# Patient Record
Sex: Male | Born: 1970 | Race: White | Hispanic: No | Marital: Married | Smoking: Current every day smoker
Health system: Southern US, Community
[De-identification: ages and names within clinical notes are randomized; demographics above are authoritative.]

## PROBLEM LIST (undated history)

## (undated) DIAGNOSIS — C801 Malignant (primary) neoplasm, unspecified: Secondary | ICD-10-CM

---

## 2004-12-19 ENCOUNTER — Ambulatory Visit: Payer: Self-pay | Admitting: Internal Medicine

## 2009-11-26 ENCOUNTER — Encounter: Admission: RE | Admit: 2009-11-26 | Discharge: 2009-11-26 | Payer: Self-pay | Admitting: Family Medicine

## 2010-04-25 ENCOUNTER — Encounter: Payer: Self-pay | Admitting: Infectious Disease

## 2010-05-13 ENCOUNTER — Ambulatory Visit: Payer: Self-pay | Admitting: Infectious Disease

## 2010-05-13 DIAGNOSIS — L0390 Cellulitis, unspecified: Secondary | ICD-10-CM

## 2010-05-13 DIAGNOSIS — B351 Tinea unguium: Secondary | ICD-10-CM | POA: Insufficient documentation

## 2010-05-13 DIAGNOSIS — J309 Allergic rhinitis, unspecified: Secondary | ICD-10-CM | POA: Insufficient documentation

## 2010-05-13 DIAGNOSIS — C439 Malignant melanoma of skin, unspecified: Secondary | ICD-10-CM | POA: Insufficient documentation

## 2010-05-13 DIAGNOSIS — L0291 Cutaneous abscess, unspecified: Secondary | ICD-10-CM

## 2010-05-13 DIAGNOSIS — F17200 Nicotine dependence, unspecified, uncomplicated: Secondary | ICD-10-CM

## 2010-05-13 DIAGNOSIS — L732 Hidradenitis suppurativa: Secondary | ICD-10-CM

## 2010-05-13 DIAGNOSIS — K219 Gastro-esophageal reflux disease without esophagitis: Secondary | ICD-10-CM

## 2010-05-14 ENCOUNTER — Encounter: Payer: Self-pay | Admitting: Infectious Disease

## 2010-07-08 NOTE — Assessment & Plan Note (Signed)
Summary: new pt recurrent abscses   Visit Type:  Consult Referring Provider:  Cain Saupe Primary Provider:  Cain Saupe  CC:  new patient abcesses.  History of Present Illness: 40 yo who was diagnosed with HS 8 yrs ago on [place] by Dr. Leota Jacobsen.  He has been having lesions in his arms and groin since the age of 48. She did trial of doxycyline x 6 months, tetracycline x6 months. Brevoxil, Clindamycin topical. He has dropped the topical agents since then. H Since then has had  less flare ups. he has been followed closely by dr. Terri Piedra (Dermatology) Had cyst on back that flared up 2.5 to 3 wks ago had lesion that flared on back. 6 by 2 inches of erythema and induration and he added keflex to mix.  Pt was seen by urgent care in [place]. He had cultures obtained. Left on the doxycyline Pt went to see Dr. Abigail Miyamoto, who referred to [facility] (Dr. Dwain Sarna), who opened and packed this. Tuesday night several weeks ago saw Dr. Abigail Miyamoto who changed him to Bactrim DS one twice a day x 5 days and one two times a day since then. Area has improved dramatically. He saw Cleophas Dunker saw him 2 hrs ago. He has one in right axilla that has reared itself, and one lesion in crease of buttocks. I have received labs from Dr. Jillyn Hidden including HIV ELISA that was negative. I  incdo not hany culture data. I spent greater than 45 minuts with htis pt including greater than 50% of time coordinating care and with face to face counselling of the pt.Dr. Para Skeans ([place] Tollie Eth)  Problems Prior to Update: None  Medications Prior to Update: 1)  None  Current Medications (verified): 1)  Bactrim Ds 800-160 Mg Tabs (Sulfamethoxazole-Trimethoprim) .Marland Kitchen.. 1 Qd 2)  Zantac 150 Mg Tabs (Ranitidine Hcl) .Marland Kitchen.. 1 Two Times A Day  Allergies (verified): No Known Drug Allergies    Current Allergies (reviewed today): No known allergies  Past History:  Past Medical History: hidradenitis suppurativa melana sp removal gerd allergic  rhinitis morbid obesity  Past Surgical History: removal of tissue in leg complicated by infection (in [place]) Hernia repair  Family History: Mom and Dad both suffered from disease with recurrence of cyts  Social History: smoker, married, has two children (one adoped) no recreational drugs  Review of Systems       The patient complains of suspicious skin lesions.  The patient denies anorexia, fever, weight loss, weight gain, vision loss, decreased hearing, hoarseness, chest pain, syncope, dyspnea on exertion, peripheral edema, prolonged cough, headaches, hemoptysis, abdominal pain, melena, hematochezia, severe indigestion/heartburn, hematuria, incontinence, genital sores, muscle weakness, transient blindness, difficulty walking, depression, unusual weight change, abnormal bleeding, enlarged lymph nodes, and breast masses.    Vital Signs:  Patient profile:   40 year old male Height:      76 inches (193.04 cm) Weight:      258.50 pounds (117.50 kg) BMI:     31.58 Pulse rate:   94 / minute BP sitting:   128 / 88  (left arm)  Vitals Entered By: Starleen Arms CMA (May 13, 2010 1:59 PM) CC: new patient abcesses Is Patient Diabetic? No Pain Assessment Patient in pain? no      Nutritional Status BMI of > 30 = obese Nutritional Status Detail nl  Does patient need assistance? Functional Status Self care Ambulation Normal   Physical Exam  General:  alert, well-developed, well-nourished, and well-hydrated.   Head:  normocephalic, atraumatic, no abnormalities observed, and no abnormalities palpated.   Eyes:  vision grossly intact, pupils equal, and pupils round.   Ears:  no external deformities.   Nose:  no external deformity and no external erythema.   Mouth:  good dentition, no gingival abnormalities, no dental plaque, pharynx pink and moist, and no exudates.   Neck:  supple and full ROM.   Lungs:  normal respiratory effort, no crackles, and no wheezes.   Heart:   normal rate, regular rhythm, no murmur, and no gallop.   Abdomen:  soft, non-tender, and no distention.   Msk:  normal ROM and no joint deformities.   Extremities:  No clubbing, cyanosis, edema, or deformity noted with normal full range of motion of all joints.   Neurologic:  alert & oriented X3, strength normal in all extremities, and gait normal.   Skin:  he has healing ulcer on lower back where he had recent abscess, he has small raised lesion at top of crack of buttocks folds that is not fluctuant. He has an area under right armpit that had drainage recently but is itself not fluctuant at present Axillary Nodes:  minimal axillary lymphadenopathy Psych:  Oriented X3, memory intact for recent and remote, normally interactive, and good eye contact.     Impression & Recommendations:  Problem # 1:  CELLULITIS AND ABSCESS OF UNSPECIFIED SITE (ICD-682.9)  This has responded to I and D by [facility] and oral bactrim. I will have him finish an additoinal month of bactrim (fine to use for HS in general) then change back to his doxycycline. Would like to get culture data. His updated medication list for this problem includes:    Bactrim Ds 800-160 Mg Tabs (Sulfamethoxazole-trimethoprim) .Marland Kitchen... 1 qd  Orders: Consultation Level V (16109)  Problem # 2:  HIDRADENITIS SUPPURATIVA (ICD-705.83)  He does not appear to have severe disease and appears to respond to use of systemic antibiotics. Topical antibiotics might have less risk than systemic ones though he is not wanting to go off of his systemic ones at present. Antiperspirant use important. I have suggested he try dairy free and or low glycemic foods to see if this helps (there is mentoine of anecdotal help here on uptodate with these measures) and paleo diet could also have benefit of weight loss. He does not (undrstandably) want anti-androgen therapy  Orders: Consultation Level V (60454)  Problem # 3:  MORBID OBESITY (ICD-278.01)  weight loss  important though avoiding excessive sweating important as well  Orders: Consultation Level V (09811)  Medications Added to Medication List This Visit: 1)  Bactrim Ds 800-160 Mg Tabs (Sulfamethoxazole-trimethoprim) .Marland Kitchen.. 1 qd 2)  Zantac 150 Mg Tabs (Ranitidine hcl) .Marland Kitchen.. 1 two times a day  Patient Instructions: 1)  Finish out a total of one month more  of bactrim DS two times a day tablets then resume doxycyline therapy 2)  rtc to see Dr.  Daiva Eves in 2 months 3)  I would see what impact a dairy free and or low glycemic diet might have: 4)  [email or web address] 5)  [email or web address] 6)  [email or web address]

## 2010-07-08 NOTE — Miscellaneous (Signed)
Summary: HIPAA Restrictions  HIPAA Restrictions   Imported By: Florinda Marker 05/14/2010 10:31:03  _____________________________________________________________________  External Attachment:    Type:   Image     Comment:   External Document

## 2010-07-10 NOTE — Consult Note (Signed)
Summary: [facility]   Imported By: Florinda Marker 05/19/2010 09:25:57  _____________________________________________________________________  External Attachment:    Type:   Image     Comment:   External Document

## 2013-09-05 ENCOUNTER — Other Ambulatory Visit: Payer: Self-pay | Admitting: Family

## 2013-09-05 DIAGNOSIS — R1011 Right upper quadrant pain: Secondary | ICD-10-CM

## 2013-09-07 ENCOUNTER — Ambulatory Visit
Admission: RE | Admit: 2013-09-07 | Discharge: 2013-09-07 | Disposition: A | Discharge status: Home / Self Care | Payer: Managed Care, Other (non HMO) | Source: Ambulatory Visit | Attending: Family | Admitting: Family

## 2013-09-07 DIAGNOSIS — R1011 Right upper quadrant pain: Secondary | ICD-10-CM

## 2013-09-22 ENCOUNTER — Other Ambulatory Visit (HOSPITAL_COMMUNITY): Payer: Self-pay | Admitting: Gastroenterology

## 2013-09-22 DIAGNOSIS — R109 Unspecified abdominal pain: Secondary | ICD-10-CM

## 2013-09-26 ENCOUNTER — Encounter (HOSPITAL_COMMUNITY)
Admission: RE | Admit: 2013-09-26 | Discharge: 2013-09-26 | Disposition: A | Discharge status: Home / Self Care | Payer: Managed Care, Other (non HMO) | Source: Ambulatory Visit | Attending: Gastroenterology | Admitting: Gastroenterology

## 2013-09-26 DIAGNOSIS — R109 Unspecified abdominal pain: Secondary | ICD-10-CM | POA: Insufficient documentation

## 2013-09-26 MED ORDER — SINCALIDE 5 MCG IJ SOLR
0.0200 ug/kg | Freq: Once | INTRAMUSCULAR | Status: AC
  Administered 2013-09-26: 2.3 ug via INTRAVENOUS

## 2013-09-26 MED ORDER — TECHNETIUM TC 99M MEBROFENIN IV KIT
5.3000 | PACK | Freq: Once | INTRAVENOUS | Status: AC | PRN
  Administered 2013-09-26: 5 via INTRAVENOUS

## 2013-12-01 ENCOUNTER — Other Ambulatory Visit: Payer: Self-pay | Admitting: Gastroenterology

## 2013-12-01 DIAGNOSIS — R197 Diarrhea, unspecified: Secondary | ICD-10-CM

## 2013-12-01 DIAGNOSIS — R109 Unspecified abdominal pain: Secondary | ICD-10-CM

## 2013-12-05 ENCOUNTER — Ambulatory Visit
Admission: RE | Admit: 2013-12-05 | Discharge: 2013-12-05 | Disposition: A | Discharge status: Home / Self Care | Payer: Managed Care, Other (non HMO) | Source: Ambulatory Visit | Attending: Gastroenterology | Admitting: Gastroenterology

## 2013-12-05 DIAGNOSIS — R197 Diarrhea, unspecified: Secondary | ICD-10-CM

## 2013-12-05 DIAGNOSIS — R109 Unspecified abdominal pain: Secondary | ICD-10-CM

## 2014-12-01 IMAGING — NM NM HEPATO W/GB/PHARM/[PERSON_NAME]
1 series · 12 of 12 positions shown · non-contrast
Comparison: None.

CLINICAL DATA: Upper abdominal pain

EXAM:
NUCLEAR MEDICINE HEPATOBILIARY IMAGING WITH GALLBLADDER EF
Views:  Anterior right upper quadrant
Radionuclide: Technetium 99m Choletec
Dose:  5.3 mCi
Route of administration: Intravenous

[Series 1: hepato · 4.46mm/px · 2 acquisitions, 12 frames shown]
[im 1/2]
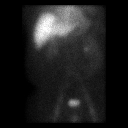
[im 1/2]
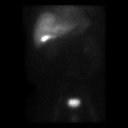
[im 1/2]
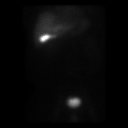
[im 1/2]
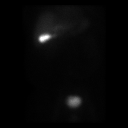
[im 1/2]
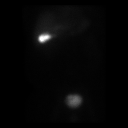
[im 1/2]
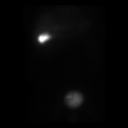
[im 2/2]
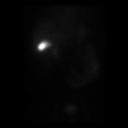
[im 2/2]
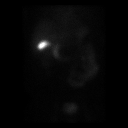
[im 2/2]
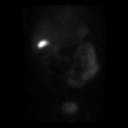
[im 2/2]
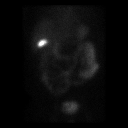
[im 2/2]
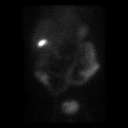
[im 2/2]
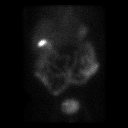

[12 of 12 positions shown; findings below may reference images not displayed]

FINDINGS: Liver uptake of radiotracer is normal. There is prompt visualization
of gallbladder and small bowel, indicating patency of the cystic and
common bile ducts. A weight based dose, 2.0 mcg, of CCK was
administered intravenously with calculation of the computer
generated ejection fraction of radiotracer from the gallbladder. The
patient did not experience clinical symptoms with CCK
administration. The ejection fraction of radiotracer from the
gallbladder is normal at 72.3%, normal greater than 38%.
IMPRESSION: Study within normal limits.

## 2015-12-05 ENCOUNTER — Emergency Department (HOSPITAL_COMMUNITY)
Admission: EM | Admit: 2015-12-05 | Discharge: 2015-12-05 | Disposition: A | Discharge status: Home / Self Care | Payer: Managed Care, Other (non HMO) | Attending: Emergency Medicine | Admitting: Emergency Medicine

## 2015-12-05 ENCOUNTER — Emergency Department (HOSPITAL_COMMUNITY): Payer: Managed Care, Other (non HMO)

## 2015-12-05 ENCOUNTER — Encounter (HOSPITAL_COMMUNITY): Payer: Self-pay

## 2015-12-05 DIAGNOSIS — F17210 Nicotine dependence, cigarettes, uncomplicated: Secondary | ICD-10-CM | POA: Insufficient documentation

## 2015-12-05 DIAGNOSIS — Z85828 Personal history of other malignant neoplasm of skin: Secondary | ICD-10-CM | POA: Insufficient documentation

## 2015-12-05 DIAGNOSIS — R079 Chest pain, unspecified: Secondary | ICD-10-CM

## 2015-12-05 DIAGNOSIS — Z7982 Long term (current) use of aspirin: Secondary | ICD-10-CM | POA: Diagnosis not present

## 2015-12-05 HISTORY — DX: Malignant (primary) neoplasm, unspecified: C80.1

## 2015-12-05 LAB — CBC
HCT: 47.7 % (ref 39.0–52.0)
HEMOGLOBIN: 16.7 g/dL (ref 13.0–17.0)
MCH: 36.2 pg — AB (ref 26.0–34.0)
MCHC: 35 g/dL (ref 30.0–36.0)
MCV: 103.5 fL — AB (ref 78.0–100.0)
Platelets: 250 10*3/uL (ref 150–400)
RBC: 4.61 MIL/uL (ref 4.22–5.81)
RDW: 12.6 % (ref 11.5–15.5)
WBC: 6.5 10*3/uL (ref 4.0–10.5)

## 2015-12-05 LAB — BASIC METABOLIC PANEL
ANION GAP: 8 (ref 5–15)
BUN: 9 mg/dL (ref 6–20)
CO2: 26 mmol/L (ref 22–32)
Calcium: 9.4 mg/dL (ref 8.9–10.3)
Chloride: 103 mmol/L (ref 101–111)
Creatinine, Ser: 0.88 mg/dL (ref 0.61–1.24)
GLUCOSE: 120 mg/dL — AB (ref 65–99)
POTASSIUM: 3.5 mmol/L (ref 3.5–5.1)
Sodium: 137 mmol/L (ref 135–145)

## 2015-12-05 LAB — D-DIMER, QUANTITATIVE: D-Dimer, Quant: <0.27 ug/mL-FEU (ref 0.00–0.50)

## 2015-12-05 LAB — I-STAT TROPONIN, ED
TROPONIN I, POC: 0 ng/mL (ref 0.00–0.08)
Troponin i, poc: 0 ng/mL (ref 0.00–0.08)

## 2015-12-05 NOTE — ED Notes (Signed)
Patient ambulated independently to the bathroom.  Denies being lightheaded

## 2015-12-05 NOTE — ED Notes (Signed)
Patient Alert and oriented X4. Stable and ambulatory. Patient verbalized understanding of the discharge instructions.  Patient belongings were taken by the patient.  

## 2015-12-05 NOTE — ED Notes (Signed)
Per Pt, Pt was flying back home yesterday when he started to become clammy with some sharp, shooting chest pain in mid-center with left arm numbness. Pt reports episodes of lightheadness that resolved. Pt went home to rest, and reports waking up this morning with similar symptoms. Diarrhea noted yesterday with some belching. Denies vomiting or radiation of pain.

## 2015-12-05 NOTE — ED Provider Notes (Signed)
CSN: [id number]     Arrival date & time 12/05/15  1242 History   None    Chief Complaint  Patient presents with  . Chest Pain     (Consider location/radiation/quality/duration/timing/severity/associated sxs/prior Treatment) HPI  Pt presenting with c/o chest pain.  Pt states he was flying back from [place] yesterday- had layover in [place] and while he was seated in the airport developed 2-3 sharp shooting chest pains in his left upper chest.  He became somewhat dizzy with the pains.  Also noted some tingling in his left upper extremity.  This morning when he woke up he had some tingling in his left arm as well.  No difficulty breathing.  No nausa or diaphoresis. No leg swelling. No hx of DVT/PE.  He has been flying for work.  No significant cough.  No treatment prior to arrival.  There are no other associated systemic symptoms, there are no other alleviating or modifying factors.   Past Medical History  Diagnosis Date  . Cancer [facility])     Skin cancer   History reviewed. No pertinent past surgical history. No family history on file. Social History  Substance Use Topics  . Smoking status: Current Every Day Smoker -- 1.00 packs/day    Types: Cigarettes  . Smokeless tobacco: Never Used  . Alcohol Use: 10.8 oz/week    18 Glasses of wine per week    Review of Systems  ROS reviewed and all otherwise negative except for mentioned in HPI    Allergies  Review of patient's allergies indicates no known allergies.  Home Medications   Prior to Admission medications   Medication Sig Start Date End Date Taking? Authorizing Provider  acetaminophen (TYLENOL) 325 MG tablet Take 650 mg by mouth every 6 (six) hours as needed for mild pain.   Yes Historical Provider, MD  aspirin-acetaminophen-caffeine (EXCEDRIN MIGRAINE) [phone] MG tablet Take 1 tablet by mouth every 6 (six) hours as needed for headache.   Yes Historical Provider, MD  doxycycline (ADOXA) 100 MG tablet Take 100-200 mg by  mouth daily. Pt alternates with tetracycline 500mg  bid 09/02/15  Yes Historical Provider, MD  ibuprofen (ADVIL,MOTRIN) 200 MG tablet Take 200 mg by mouth every 6 (six) hours as needed for moderate pain.   Yes Historical Provider, MD  naproxen sodium (ANAPROX) 220 MG tablet Take 220 mg by mouth 2 (two) times daily as needed (pain).   Yes Historical Provider, MD  tetracycline (ACHROMYCIN,SUMYCIN) 500 MG capsule Take 500 mg by mouth 2 (two) times daily.   Yes Historical Provider, MD   BP 128/92 mmHg  Pulse 75  Temp(Src) 98.2 F (36.8 C) (Oral)  Resp 14  SpO2 98%  Vitals reviewed Physical Exam  Physical Examination: General appearance - alert, well appearing, and in no distress Mental status - alert, oriented to person, place, and time Eyes - no conjunctival injection no scleral icterus Mouth - mucous membranes moist, pharynx normal without lesions Chest - clear to auscultation, no wheezes, rales or rhonchi, symmetric air entry Heart - normal rate, regular rhythm, normal S1, S2, no murmurs, rubs, clicks or gallops Abdomen - soft, nontender, nondistended, no masses or organomegaly Neurological - alert, oriented, normal speech Extremities - peripheral pulses normal, no pedal edema, no clubbing or cyanosis Skin - normal coloration and turgor, no rashes  ED Course  Procedures (including critical care time) Labs Review Labs Reviewed  BASIC METABOLIC PANEL - Abnormal; Notable for the following:    Glucose, Bld 120 (*)  All other components within normal limits  CBC - Abnormal; Notable for the following:    MCV 103.5 (*)    MCH 36.2 (*)    All other components within normal limits  D-DIMER, QUANTITATIVE (NOT AT [facility])  I-STAT TROPOININ, ED  Randolm Idol, ED    Imaging Review Dg Chest 2 View  12/05/2015  CLINICAL DATA:  Syncope and chest pain for 2 days EXAM: CHEST  2 VIEW COMPARISON:  11/26/2009 FINDINGS: The heart size and mediastinal contours are within normal limits. Both lungs  are clear. The visualized skeletal structures are unremarkable. IMPRESSION: No active cardiopulmonary disease. Electronically Signed   By: Inez Catalina M.D.   On: 12/05/2015 14:10   I have personally reviewed and evaluated these images and lab results as part of my medical decision-making.   EKG Interpretation   Date/Time:  Thursday December 05 2015 12:45:44 EDT Ventricular Rate:  101 PR Interval:  134 QRS Duration: 86 QT Interval:  334 QTC Calculation: 433 R Axis:   29 Text Interpretation:  Sinus tachycardia T wave abnormality, consider  inferior ischemia Abnormal ECG No old tracing to compare Confirmed by  [facility]  MD, Jaicob Dia [phone]) on 12/05/2015 1:47:00 PM      MDM   Final diagnoses:  Chest pain, unspecified chest pain type    Pt presenting with c/o chest pain.  Pt has a heart score of 2, was also flying for work- d-dimer is negative which effectively rules out PE in this patient.  2 sets of troponins were obtained and negative, doubt ACS.  Pt advised to f/u with PMD closely.  Discharged with strict return precautions.  Pt agreeable with plan.  Pt with a heart score of 2  Alfonzo Beers, MD 12/06/15 1016

## 2015-12-05 NOTE — Discharge Instructions (Signed)
Return to the ED with any concerns including difficulty breathing, recurrence of chest pain, leg swelling, fainting, decreased level of alertness/lethargy, or any other alarming symptoms

## 2016-02-16 NOTE — Progress Notes (Signed)
Cardiology Office Note   Date:  02/17/2016   ID:  William White, DOB [birth year], MRN [id number]  PCP:  Lujean Amel, MD    No chief complaint on file.  Chest pain  Wt Readings from Last 3 Encounters:  02/17/16 274 lb (124.3 kg)  09/26/13 258 lb (117 kg)  05/13/10 (!) 258 lb 8 oz (117.3 kg)       History of Present Illness: William White is a 45 y.o. male  With a h/o tobaco abuse who was flying back from [place] in Jun 2017- he had layover in [place] and while he was seated in the airport developed 2-3 sharp shooting chest pains in his left upper chest.  He became somewhat dizzy with the pains.  Also noted some tingling in his left upper extremity.  The next morning when he woke up he had some tingling in his left arm as well.  No difficulty breathing.  No nausa or diaphoresis. No leg swelling. No hx of DVT/PE.   He went to the ER the next day and had a negative evaluation.   Within 10 days,he had 2 more episodes like this.  He was dizzy with some irregular heart beat and left arm tingling.  Episodes would last 45 minutes.    Nothing since that time.  No regular exercise.    He quite smoking Aug 29.  He started Chantix.    No early CAD in the family.  Weight has increased over the past few years.  He eats better on the road.  Diet at home is very poor.      Past Medical History:  Diagnosis Date  . Cancer [facility])    Skin cancer    No past surgical history on file.   Current Outpatient Prescriptions  Medication Sig Dispense Refill  . acetaminophen (TYLENOL) 325 MG tablet Take 650 mg by mouth every 6 (six) hours as needed for mild pain.    Marland Kitchen aspirin-acetaminophen-caffeine (EXCEDRIN MIGRAINE) 250-250-65 MG tablet Take 1 tablet by mouth every 6 (six) hours as needed for headache.    . CHANTIX 1 MG tablet Take 1 mg by mouth 2 (two) times daily.    Marland Kitchen doxycycline (ADOXA) 100 MG tablet Take 100-200 mg by mouth daily. Pt alternates with tetracycline 500mg  bid   1  . ibuprofen (ADVIL,MOTRIN) 200 MG tablet Take 200 mg by mouth every 6 (six) hours as needed for moderate pain.    . naproxen sodium (ANAPROX) 220 MG tablet Take 220 mg by mouth 2 (two) times daily as needed (pain).    Marland Kitchen tetracycline (ACHROMYCIN,SUMYCIN) 500 MG capsule Take 500 mg by mouth 2 (two) times daily.     No current facility-administered medications for this visit.     Allergies:   Review of patient's allergies indicates no known allergies.    Social History:  The patient  reports that he has been smoking Cigarettes.  He has been smoking about 1.00 pack per day. He has never used smokeless tobacco. He reports that he drinks about 10.8 oz of alcohol per week . He reports that he does not use drugs.   Family History:  The patient's family history is not on file. No early CAD.   ROS:  Please see the history of present illness.   Otherwise, review of systems are positive for chest pain-above.   All other systems are reviewed and negative.    PHYSICAL EXAM: VS:  BP (!) 134/100 (BP Location: Left Arm,  Patient Position: Sitting, Cuff Size: Large)   Pulse 74   Ht 6\' 4"  (1.93 m)   Wt 274 lb (124.3 kg)   BMI 33.35 kg/m  , BMI Body mass index is 33.35 kg/m. GEN: Well nourished, well developed, in no acute distress  HEENT: normal  Neck: no JVD, carotid bruits, or masses Cardiac: RRR; no murmurs, rubs, or gallops,no edema  Respiratory:  clear to auscultation bilaterally, normal work of breathing GI: soft, nontender, nondistended, + BS MS: no deformity or atrophy  Skin: warm and dry, no rash Neuro:  Strength and sensation are intact Psych: euthymic mood, full affect   EKG:   The ekg ordered today demonstrates normal ECG   Recent Labs: 12/05/2015: BUN 9; Creatinine, Ser 0.88; Hemoglobin 16.7; Platelets 250; Potassium 3.5; Sodium 137   Lipid Panel No results found for: CHOL, TRIG, HDL, CHOLHDL, VLDL, LDLCALC, LDLDIRECT   Other studies Reviewed: Additional studies/ records  that were reviewed today with results demonstrating: ER records. Labs from [facility]: 2017: TG 276; HDL 36; LDL 99   ASSESSMENT AND PLAN:  1. Chest pain: Atypical. He has had several episodes. We'll plan for GXT prior to starting him on an exercise regimen. 2. Tobacco abuse:  He needs to stop smoking.  Now using Chantix. He has stopped for nearly 2 weeks. 3. Hypertriglyceridemia: Diet changes should help.  We talked about avoiding white bread and other sources of carbohydrates. This will help his triglycerides. He'll try to stick to more high-fiber high-protein foods. He also wants to lose weight. This will help his long-term risk profile as well.   Current medicines are reviewed at length with the patient today.  The patient concerns regarding his medicines were addressed.  The following changes have been made:  No change  Labs/ tests ordered today include:  No orders of the defined types were placed in this encounter.   Recommend 150 minutes/week of aerobic exercise Low fat, low carb, high fiber diet recommended  Disposition:   FU for GXT; if normal then 1 year f/u   Signed, Larae Grooms, MD  02/17/2016 11:56 AM    [facility] [address], [place], [place]  [address] Phone: [phone]; Fax: [phone]

## 2016-02-17 ENCOUNTER — Ambulatory Visit (INDEPENDENT_AMBULATORY_CARE_PROVIDER_SITE_OTHER): Payer: Managed Care, Other (non HMO) | Admitting: Interventional Cardiology

## 2016-02-17 ENCOUNTER — Encounter (INDEPENDENT_AMBULATORY_CARE_PROVIDER_SITE_OTHER): Payer: Self-pay

## 2016-02-17 ENCOUNTER — Encounter: Payer: Self-pay | Admitting: Interventional Cardiology

## 2016-02-17 VITALS — BP 134/100 | HR 74 | Ht 76.0 in | Wt 274.0 lb

## 2016-02-17 DIAGNOSIS — F17200 Nicotine dependence, unspecified, uncomplicated: Secondary | ICD-10-CM | POA: Diagnosis not present

## 2016-02-17 DIAGNOSIS — R079 Chest pain, unspecified: Secondary | ICD-10-CM | POA: Diagnosis not present

## 2016-02-17 DIAGNOSIS — E669 Obesity, unspecified: Secondary | ICD-10-CM | POA: Insufficient documentation

## 2016-02-17 NOTE — Patient Instructions (Signed)
Your physician recommends that you continue on your current medications as directed. Please refer to the Current Medication list given to you today.  Your physician has requested that you have an exercise tolerance test. For further information please visit [email or web address]. Please also follow instruction sheet, as given.  Your physician wants you to follow-up in: 1 year with Dr. Irish Lack.  You will receive a reminder letter in the mail two months in advance. If you don't receive a letter, please call our office to schedule the follow-up appointment.

## 2016-02-24 ENCOUNTER — Encounter: Payer: Self-pay | Admitting: Interventional Cardiology

## 2016-02-25 ENCOUNTER — Ambulatory Visit (INDEPENDENT_AMBULATORY_CARE_PROVIDER_SITE_OTHER): Payer: Managed Care, Other (non HMO)

## 2016-02-25 DIAGNOSIS — R079 Chest pain, unspecified: Secondary | ICD-10-CM

## 2016-02-25 LAB — EXERCISE TOLERANCE TEST
CHL CUP MPHR: 176 {beats}/min
CHL CUP STRESS STAGE 1 GRADE: 0 %
CHL CUP STRESS STAGE 1 SPEED: 0 mph
CHL CUP STRESS STAGE 2 HR: 93 {beats}/min
CHL CUP STRESS STAGE 2 SPEED: 1 mph
CHL CUP STRESS STAGE 3 GRADE: 0.1 %
CHL CUP STRESS STAGE 3 HR: 93 {beats}/min
CHL CUP STRESS STAGE 4 DBP: 93 mmHg
CHL CUP STRESS STAGE 4 SBP: 163 mmHg
CHL CUP STRESS STAGE 4 SPEED: 1.7 mph
CHL CUP STRESS STAGE 5 GRADE: 12 %
CHL CUP STRESS STAGE 5 HR: 137 {beats}/min
CHL CUP STRESS STAGE 6 DBP: 89 mmHg
CHL CUP STRESS STAGE 6 GRADE: 14 %
CHL CUP STRESS STAGE 6 SPEED: 3.4 mph
CHL CUP STRESS STAGE 7 GRADE: 16 %
CHL CUP STRESS STAGE 7 HR: 176 {beats}/min
CHL CUP STRESS STAGE 7 SPEED: 4.2 mph
CHL CUP STRESS STAGE 8 DBP: 84 mmHg
CHL CUP STRESS STAGE 8 HR: 157 {beats}/min
CHL CUP STRESS STAGE 8 SBP: 184 mmHg
CHL CUP STRESS STAGE 9 HR: 106 {beats}/min
CHL CUP STRESS STAGE 9 SBP: 164 mmHg
CSEPED: 10 min
CSEPEW: 11.7 METS
Exercise duration (sec): 1 s
Peak HR: 176 {beats}/min
Percent HR: 100 %
Percent of predicted max HR: 100 %
RPE: 17
Rest HR: 78 {beats}/min
Stage 1 DBP: 94 mmHg
Stage 1 HR: 86 {beats}/min
Stage 1 SBP: 152 mmHg
Stage 2 Grade: 0 %
Stage 3 Speed: 1 mph
Stage 4 Grade: 10 %
Stage 4 HR: 120 {beats}/min
Stage 5 DBP: 93 mmHg
Stage 5 SBP: 197 mmHg
Stage 5 Speed: 2.5 mph
Stage 6 HR: 166 {beats}/min
Stage 6 SBP: 212 mmHg
Stage 8 Grade: 0 %
Stage 8 Speed: 0 mph
Stage 9 DBP: 94 mmHg
Stage 9 Grade: 0 %
Stage 9 Speed: 0 mph

## 2017-02-08 IMAGING — DX DG CHEST 2V
2 series · 2 of 2 positions shown · non-contrast
Comparison: 11/26/2009

CLINICAL DATA: Syncope and chest pain for 2 days

EXAM:
CHEST  2 VIEW

[chest pa]
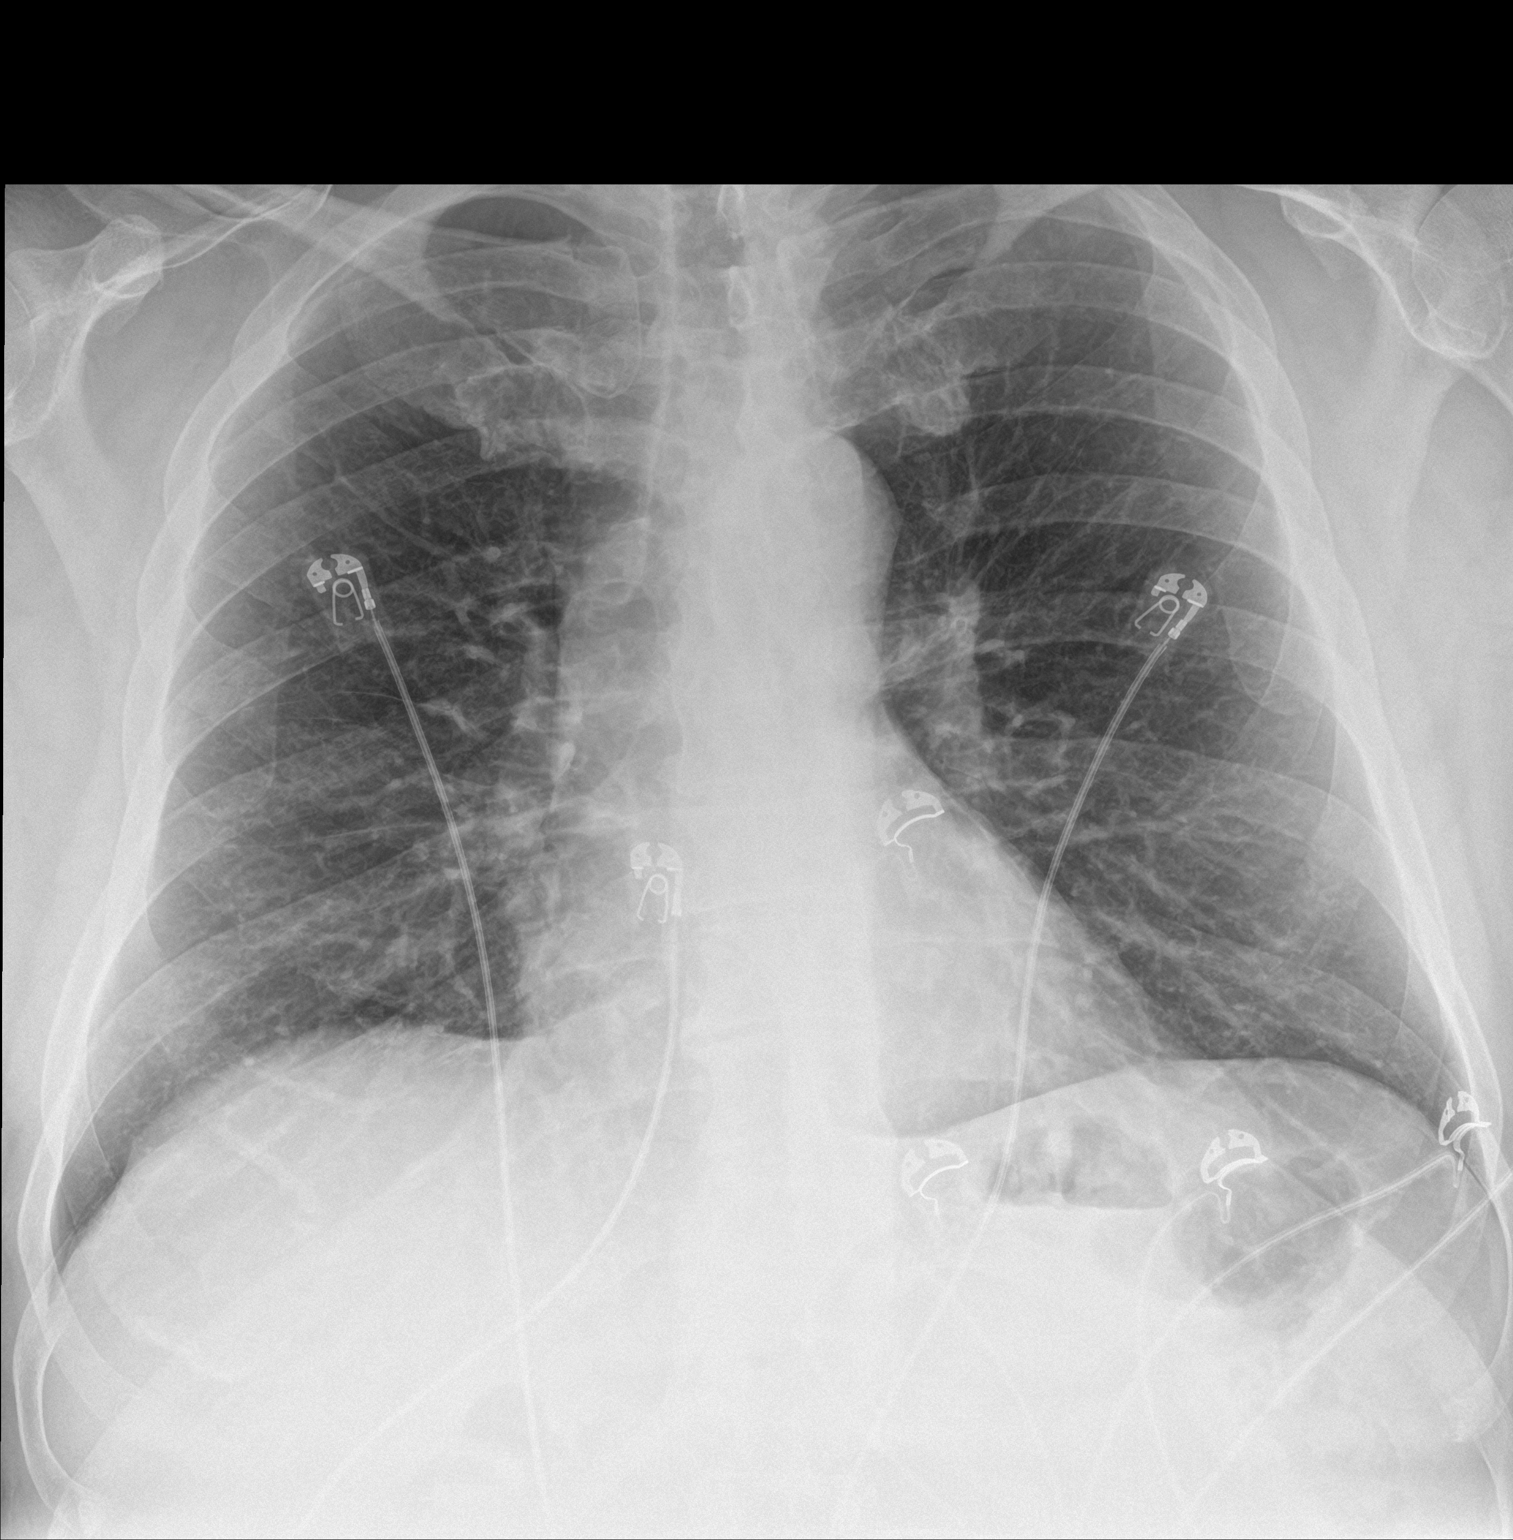

[chest lat]
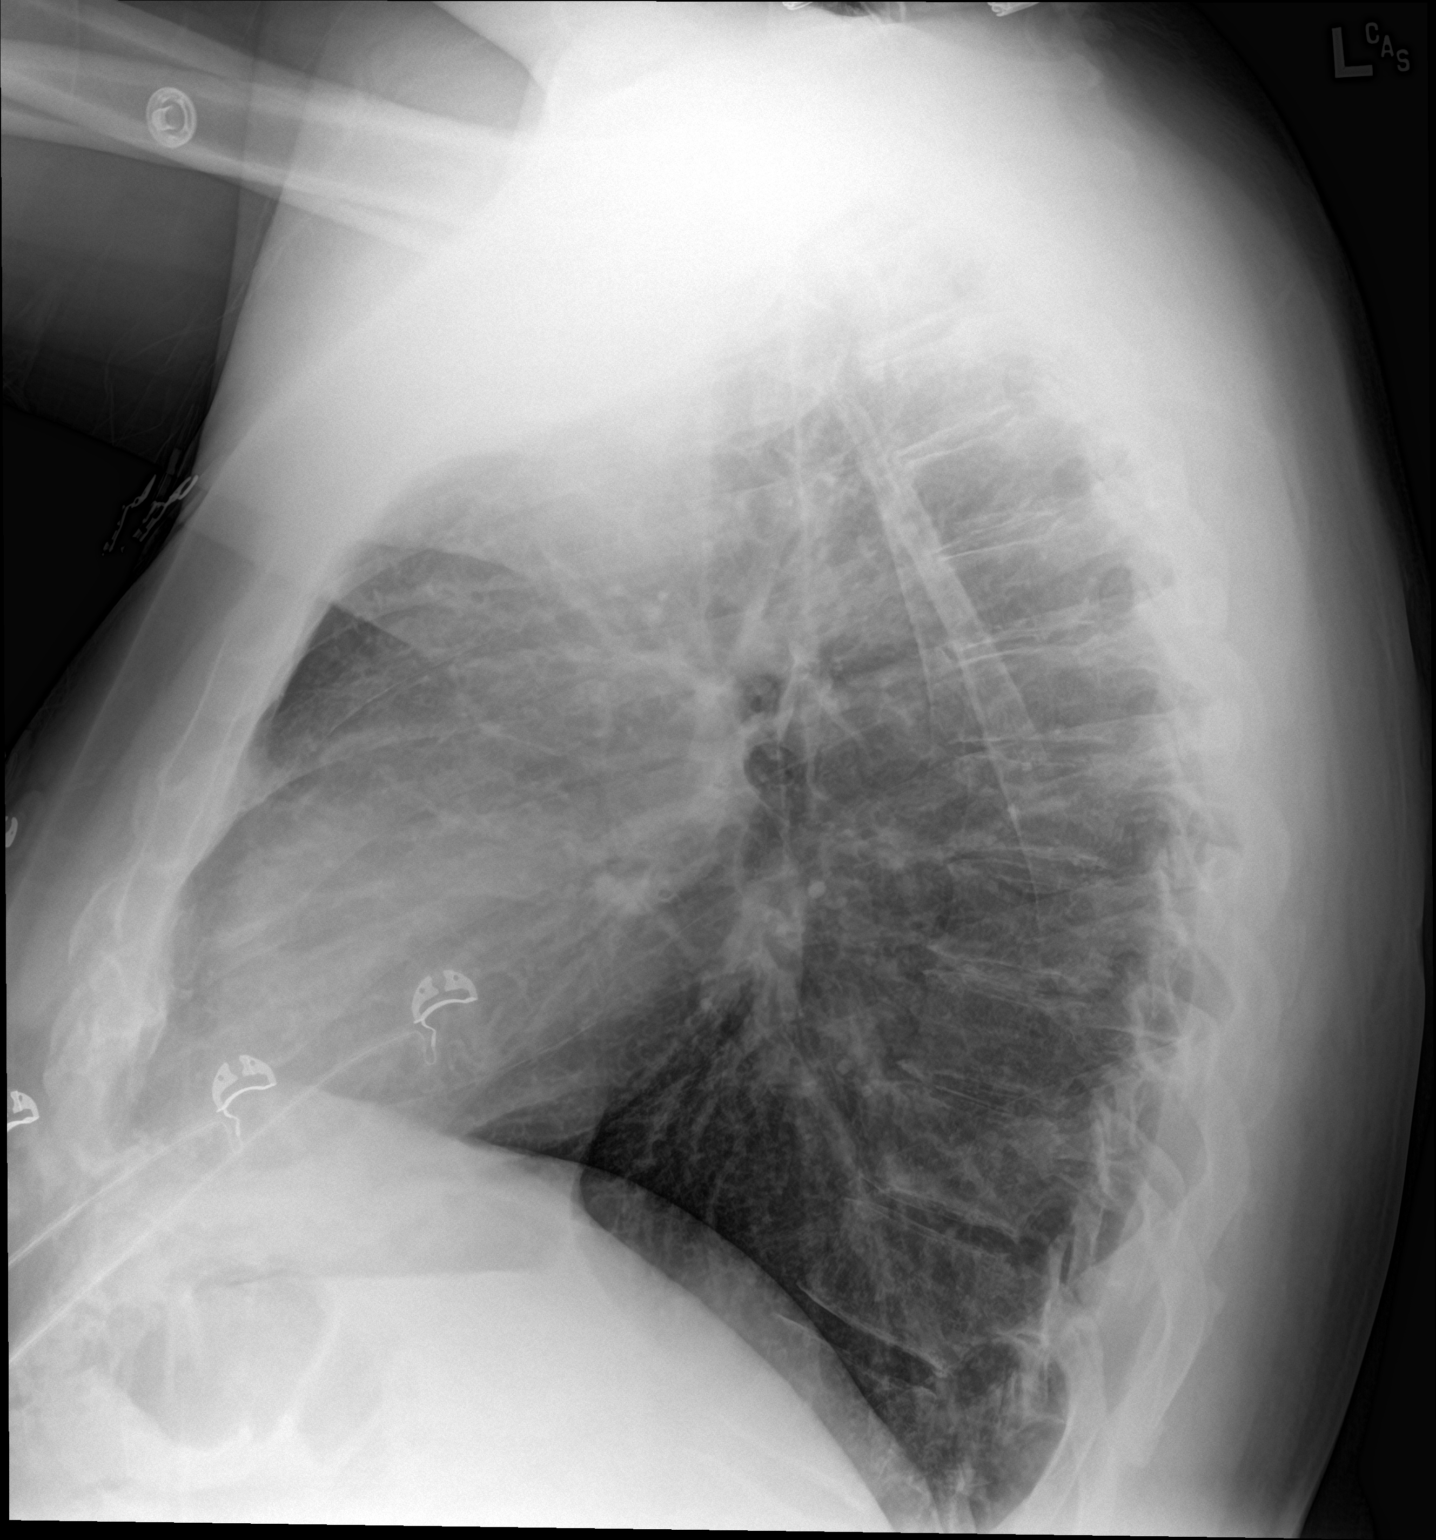

[2 of 2 positions shown; findings below may reference images not displayed]

FINDINGS: The heart size and mediastinal contours are within normal limits.
Both lungs are clear. The visualized skeletal structures are
unremarkable.
IMPRESSION: No active cardiopulmonary disease.

## 2023-04-06 ENCOUNTER — Other Ambulatory Visit: Payer: Self-pay

## 2023-04-06 DIAGNOSIS — F17210 Nicotine dependence, cigarettes, uncomplicated: Secondary | ICD-10-CM

## 2023-04-06 DIAGNOSIS — Z87891 Personal history of nicotine dependence: Secondary | ICD-10-CM

## 2023-04-06 DIAGNOSIS — Z122 Encounter for screening for malignant neoplasm of respiratory organs: Secondary | ICD-10-CM

## 2023-04-09 ENCOUNTER — Ambulatory Visit (INDEPENDENT_AMBULATORY_CARE_PROVIDER_SITE_OTHER): Payer: Commercial Managed Care - PPO | Admitting: Acute Care

## 2023-04-09 DIAGNOSIS — F17210 Nicotine dependence, cigarettes, uncomplicated: Secondary | ICD-10-CM

## 2023-04-09 NOTE — Patient Instructions (Signed)

## 2023-04-09 NOTE — Progress Notes (Signed)
 Virtual Visit via Telephone Note  I connected with William White on 04/09/23 at  2:30 PM EDT by telephone and verified that I am speaking with the correct person using two identifiers.  Location: Patient: in home Provider: [address], [place], [place], [address]    Shared Decision Making Visit Lung Cancer Screening Program [phone])   Eligibility: Age 52 y.o. Pack Years Smoking History Calculation 36 (# packs/per year x # years smoked) Recent History of coughing up blood  no Unexplained weight loss? no ( >Than 15 pounds within the last 6 months ) Prior History Lung / other cancer melanoma- 10 years ago (Diagnosis within the last 5 years already requiring surveillance chest CT Scans). Smoking Status Current Smoker Former Smokers: Years since quit: NA  Quit Date: NA  Visit Components: Discussion included one or more decision making aids. yes Discussion included risk/benefits of screening. yes Discussion included potential follow up diagnostic testing for abnormal scans. yes Discussion included meaning and risk of over diagnosis. yes Discussion included meaning and risk of False Positives. yes Discussion included meaning of total radiation exposure. yes  Counseling Included: Importance of adherence to annual lung cancer LDCT screening. yes Impact of comorbidities on ability to participate in the program. yes Ability and willingness to under diagnostic treatment. yes  Smoking Cessation Counseling: Current Smokers:  Discussed importance of smoking cessation. yes Information about tobacco cessation classes and interventions provided to patient. yes Patient provided with "ticket" for LDCT Scan. yes Symptomatic Patient. no  Counseling NA Diagnosis Code: Tobacco Use Z72.0 Asymptomatic Patient yes  Counseling (Intermediate counseling: > three minutes counseling) U0454 Former Smokers:  Discussed the importance of maintaining cigarette abstinence. yes Diagnosis Code:  Personal History of Nicotine Dependence. U98.119 Information about tobacco cessation classes and interventions provided to patient. Yes Patient provided with "ticket" for LDCT Scan. yes Written Order for Lung Cancer Screening with LDCT placed in Epic. Yes (CT Chest Lung Cancer Screening Low Dose W/O CM) [id number] Z12.2-Screening of respiratory organs Z87.891-Personal history of nicotine dependence   Karl Bales, RN 04/09/23

## 2023-04-16 ENCOUNTER — Ambulatory Visit
Admission: RE | Admit: 2023-04-16 | Discharge: 2023-04-16 | Disposition: A | Discharge status: Home / Self Care | Payer: Commercial Managed Care - PPO | Source: Ambulatory Visit | Attending: Family Medicine | Admitting: Family Medicine

## 2023-04-16 DIAGNOSIS — F17210 Nicotine dependence, cigarettes, uncomplicated: Secondary | ICD-10-CM

## 2023-04-16 DIAGNOSIS — Z87891 Personal history of nicotine dependence: Secondary | ICD-10-CM

## 2023-04-16 DIAGNOSIS — Z122 Encounter for screening for malignant neoplasm of respiratory organs: Secondary | ICD-10-CM

## 2023-05-14 ENCOUNTER — Other Ambulatory Visit: Payer: Self-pay | Admitting: Acute Care

## 2023-05-14 DIAGNOSIS — Z122 Encounter for screening for malignant neoplasm of respiratory organs: Secondary | ICD-10-CM

## 2023-05-14 DIAGNOSIS — F17210 Nicotine dependence, cigarettes, uncomplicated: Secondary | ICD-10-CM

## 2023-05-14 DIAGNOSIS — Z87891 Personal history of nicotine dependence: Secondary | ICD-10-CM

## 2023-10-22 ENCOUNTER — Other Ambulatory Visit: Payer: Self-pay | Admitting: Physician Assistant

## 2023-10-22 DIAGNOSIS — M5416 Radiculopathy, lumbar region: Secondary | ICD-10-CM

## 2023-10-26 ENCOUNTER — Ambulatory Visit
Admission: RE | Admit: 2023-10-26 | Discharge: 2023-10-26 | Disposition: A | Discharge status: Home / Self Care | Source: Ambulatory Visit | Attending: Physician Assistant | Admitting: Physician Assistant

## 2023-10-26 DIAGNOSIS — M5416 Radiculopathy, lumbar region: Secondary | ICD-10-CM

## 2024-03-22 ENCOUNTER — Other Ambulatory Visit (HOSPITAL_BASED_OUTPATIENT_CLINIC_OR_DEPARTMENT_OTHER): Payer: Self-pay | Admitting: Family Medicine

## 2024-03-22 DIAGNOSIS — E7800 Pure hypercholesterolemia, unspecified: Secondary | ICD-10-CM

## 2024-03-27 ENCOUNTER — Ambulatory Visit (HOSPITAL_BASED_OUTPATIENT_CLINIC_OR_DEPARTMENT_OTHER)
Admission: RE | Admit: 2024-03-27 | Discharge: 2024-03-27 | Disposition: A | Discharge status: Home / Self Care | Payer: Self-pay | Source: Ambulatory Visit | Attending: Family Medicine | Admitting: Family Medicine

## 2024-03-27 DIAGNOSIS — E7800 Pure hypercholesterolemia, unspecified: Secondary | ICD-10-CM | POA: Insufficient documentation

## 2024-04-13 ENCOUNTER — Telehealth: Payer: Self-pay | Admitting: Acute Care

## 2024-04-13 NOTE — Telephone Encounter (Signed)
 Returned call for scheduling LDCT> no answer.  Left VM to call [phone] for scheduling or [phone] if unable to reach

## 2024-04-20 ENCOUNTER — Ambulatory Visit
Admission: RE | Admit: 2024-04-20 | Discharge: 2024-04-20 | Disposition: A | Source: Ambulatory Visit | Attending: Acute Care | Admitting: Acute Care

## 2024-04-20 DIAGNOSIS — F17210 Nicotine dependence, cigarettes, uncomplicated: Secondary | ICD-10-CM

## 2024-04-20 DIAGNOSIS — Z87891 Personal history of nicotine dependence: Secondary | ICD-10-CM

## 2024-04-20 DIAGNOSIS — Z122 Encounter for screening for malignant neoplasm of respiratory organs: Secondary | ICD-10-CM

## 2024-04-27 ENCOUNTER — Ambulatory Visit

## 2024-04-27 VITALS — BP 121/77 | HR 105 | Temp 98.0°F | Ht 75.0 in | Wt 228.0 lb

## 2024-04-27 DIAGNOSIS — Z122 Encounter for screening for malignant neoplasm of respiratory organs: Secondary | ICD-10-CM

## 2024-04-27 DIAGNOSIS — Z720 Tobacco use: Secondary | ICD-10-CM

## 2024-04-27 DIAGNOSIS — J432 Centrilobular emphysema: Secondary | ICD-10-CM | POA: Diagnosis not present

## 2024-04-27 DIAGNOSIS — F17210 Nicotine dependence, cigarettes, uncomplicated: Secondary | ICD-10-CM | POA: Diagnosis not present

## 2024-04-27 DIAGNOSIS — R0602 Shortness of breath: Secondary | ICD-10-CM

## 2024-04-27 DIAGNOSIS — R062 Wheezing: Secondary | ICD-10-CM

## 2024-04-27 DIAGNOSIS — I2510 Atherosclerotic heart disease of native coronary artery without angina pectoris: Secondary | ICD-10-CM

## 2024-04-27 LAB — CBC WITH DIFFERENTIAL/PLATELET
Basophils Absolute: 0 K/uL (ref 0.0–0.1)
Basophils Relative: 0.7 % (ref 0.0–3.0)
Eosinophils Absolute: 0.3 K/uL (ref 0.0–0.7)
Eosinophils Relative: 4 % (ref 0.0–5.0)
HCT: 42.4 % (ref 39.0–52.0)
Hemoglobin: 14.6 g/dL (ref 13.0–17.0)
Lymphocytes Relative: 36.2 % (ref 12.0–46.0)
Lymphs Abs: 2.5 K/uL (ref 0.7–4.0)
MCHC: 34.4 g/dL (ref 30.0–36.0)
MCV: 111.5 fl — ABNORMAL HIGH (ref 78.0–100.0)
Monocytes Absolute: 0.7 K/uL (ref 0.1–1.0)
Monocytes Relative: 10.7 % (ref 3.0–12.0)
Neutro Abs: 3.4 K/uL (ref 1.4–7.7)
Neutrophils Relative %: 48.4 % (ref 43.0–77.0)
Platelets: 351 K/uL (ref 150.0–400.0)
RBC: 3.8 Mil/uL — ABNORMAL LOW (ref 4.22–5.81)
RDW: 12.8 % (ref 11.5–15.5)
WBC: 7 K/uL (ref 4.0–10.5)

## 2024-04-27 MED ORDER — BREZTRI AEROSPHERE 160-9-4.8 MCG/ACT IN AERO
2.0000 | INHALATION_SPRAY | Freq: Two times a day (BID) | RESPIRATORY_TRACT | Status: AC
Start: 2024-04-27 — End: ?

## 2024-04-27 MED ORDER — FLUTICASONE-SALMETEROL 115-21 MCG/ACT IN AERO: 2.0000 | INHALATION_SPRAY | Freq: Two times a day (BID) | RESPIRATORY_TRACT | 12 refills | Status: AC

## 2024-04-27 MED ORDER — VARENICLINE TARTRATE 0.5 MG PO TABS: ORAL_TABLET | ORAL | 0 refills | Status: AC

## 2024-04-27 MED ORDER — VARENICLINE TARTRATE 1 MG PO TABS: 1.0000 mg | ORAL_TABLET | Freq: Two times a day (BID) | ORAL | 2 refills | Status: AC

## 2024-04-27 NOTE — Progress Notes (Signed)
 New Patient Pulmonology Office Visit   Subjective:  Patient ID: William White, male    DOB: [birth year]  MRN: [id number]  Referred by: Regino Slater, MD  CC:  Chief Complaint  Patient presents with   Consult    History of oneuomia    Shortness of Breath    Any kind of activity. Started a year ago    Shortness of Breath   William White is a 53 y.o. male who is referred to this clinic for emphysema  Discussed the use of AI scribe software for clinical note transcription with the patient, who gave verbal consent to proceed.  History of Present Illness William White is a 53 year old male who presents with shortness of breath. He was referred to pulmonary and cardiology for further evaluation.  He experiences shortness of breath, particularly during physical activities such as climbing stairs or doing yard work. He often needs to stop and take deep breaths due to lightheadedness. He has a significant smoking history of 37 years and has attempted to quit multiple times, using Chantix  and Wellbutrin with temporary success.  He had pneumonia twice last year, which required antibiotics and coincided with the flu. Last winter was particularly difficult, with frequent visits to the walk-in clinic. He received a flu shot this year.  He experiences daily cough and wheezing, with sinus drainage causing a sensation of something being stuck in his throat. No asthma and no family history of allergies.  He has a history of childhood allergies, for which he received allergy shots twice a week, but he has outgrown them. 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     Review of Systems  Respiratory:  Positive for shortness of breath.     Allergies: Patient has no known allergies.  Current Outpatient Medications:    acetaminophen (TYLENOL) 325 MG tablet, Take 650 mg by mouth every 6 (six) hours as needed for mild pain.,  Disp: , Rfl:    aspirin EC 81 MG tablet, Take 81 mg by mouth daily. Swallow whole., Disp: , Rfl:    fluticasone -salmeterol (ADVAIR HFA) 115-21 MCG/ACT inhaler, Inhale 2 puffs into the lungs 2 (two) times daily., Disp: 1 each, Rfl: 12   varenicline  (CHANTIX ) 0.5 MG tablet, Start by taking 1 tablet a day for initial 3 days, then 1 tab twice a day for 4 days, Disp: 11 tablet, Rfl: 0   varenicline  (CHANTIX ) 1 MG tablet, Take 1 tablet (1 mg total) by mouth 2 (two) times daily., Disp: 30 tablet, Rfl: 2   aspirin-acetaminophen-caffeine (EXCEDRIN MIGRAINE) 250-250-65 MG tablet, Take 1 tablet by mouth every 6 (six) hours as needed for headache. (Patient not taking: Reported on 04/27/2024), Disp: , Rfl:    doxycycline (ADOXA) 100 MG tablet, Take 100-200 mg by mouth daily. Pt alternates with tetracycline 500mg  bid, Disp: , Rfl: 1   ibuprofen (ADVIL,MOTRIN) 200 MG tablet, Take 200 mg by mouth every 6 (six) hours as needed for moderate pain. (Patient not taking: Reported on 04/27/2024), Disp: , Rfl:    naproxen sodium (ANAPROX) 220 MG tablet, Take 220 mg by mouth 2 (two) times daily as needed (pain)., Disp: , Rfl:    tetracycline (ACHROMYCIN,SUMYCIN) 500 MG capsule, Take 500 mg by mouth 2 (two) times daily., Disp: , Rfl:  Past Medical History:  Diagnosis Date   Cancer (HCC)    Skin cancer   History reviewed. No pertinent  surgical history. Family History  Problem Relation Age of Onset   Heart disease Neg Hx    Social History   Socioeconomic History   Marital status: Married    Spouse name: Not on file   Number of children: Not on file   Years of education: Not on file   Highest education level: Not on file  Occupational History   Not on file  Tobacco Use   Smoking status: Every Day    Current packs/day: 1.00    Types: Cigarettes   Smokeless tobacco: Never   Tobacco comments:    Started smoking at age 51. 1  1/2 daily. 04/27/2024.  Substance and Sexual Activity   Alcohol use: Yes     Alcohol/week: 18.0 standard drinks of alcohol    Types: 18 Glasses of wine per week   Drug use: No   Sexual activity: Not on file  Other Topics Concern   Not on file  Social History Narrative   Not on file   Social Drivers of Health   Financial Resource Strain: Not on file  Food Insecurity: Not on file  Transportation Needs: Not on file  Physical Activity: Not on file  Stress: Not on file  Social Connections: Not on file  Intimate Partner Violence: Not on file         Objective:  BP 121/77   Pulse (!) 105   Temp 98 F (36.7 C) (Oral)   Ht 6' 3 (1.905 m)   Wt 228 lb (103.4 kg)   SpO2 98%   BMI 28.50 kg/m    Physical Exam Constitutional:      General: He is not in acute distress.    Appearance: Normal appearance.  HENT:     Mouth/Throat:     Mouth: Mucous membranes are moist.  Cardiovascular:     Rate and Rhythm: Normal rate.  Pulmonary:     Effort: No respiratory distress.     Breath sounds: Wheezing present. No rales.  Musculoskeletal:     Right lower leg: No edema.     Left lower leg: No edema.  Skin:    General: Skin is warm.  Neurological:     Mental Status: He is alert and oriented to person, place, and time.  Psychiatric:        Mood and Affect: Mood normal.     Diagnostic Review:    Pft     No data to display               Results RADIOLOGY Lung CT: No concerning nodules, minimal emphysema (04/2024) Cardiac CT: Calcifications on coronary vessels as per the pt     Assessment & Plan:   Assessment & Plan Centrilobular emphysema (HCC) Discussed the symptoms, etiology, pathophysiology, diagnostic test, treatment, flare ups,  prognosis  of copd Pt has significant wheezing. Reports childhood history of significant allergies and needing allergy shots 2 bronchitis episodes last year Uncontrolled COPD Will prescribe advair  2 puff BID I advised pt to rinse mouth after inhaler use  Orders:   Pulmonary function test; Future   CBC  with Differential   IgE; Future   fluticasone -salmeterol (ADVAIR HFA) 115-21 MCG/ACT inhaler; Inhale 2 puffs into the lungs 2 (two) times daily.  Tobacco abuse Smoking/Tobacco Cessation Counseling William White is a current user of tobacco or nicotine products. He is ready to quit at this time. Counseling provided today addressed the risks of continued use and the benefits of cessation. Discussed tobacco/nicotine use  history, readiness to quit, and evidence-based treatment options including behavioral strategies, support resources, and pharmacologic therapies. Provided encouragement and educational materials on steps and resources to quit smoking. Patient questions were addressed, and follow-up recommended for continued support. Total time spent on counseling: 10 minutes. Pt has quit smoking in the past with the help of chantix. Requesting the same this time    Orders:   CBC with Differential   IgE; Future  Screening for lung cancer Reviewed ct chest Nov 2024 and Nov 2025 No concerning nodules Due for scan in Nov 2026    SOB (shortness of breath) Likely related to copd Need to r/o ischemic causes as well    Coronary artery calcification D/w patient Pt is undergoing cardiac work up     Wheezing Can't rule out asthma copd overlap Childhood hx of allergies Orders:   CBC with Differential   IgE; Future   fluticasone-salmeterol (ADVAIR HFA) 115-21 MCG/ACT inhaler; Inhale 2 puffs into the lungs 2 (two) times daily.  Uptodate with pneumonia and flu shot  Thank you for the opportunity to take part in the care of William White   Return in about 7 weeks (around 06/15/2024).   Suhan Paci Pleas, MD [facility] Office: [phone]

## 2024-04-27 NOTE — Patient Instructions (Addendum)
 It was a pleasure to see you today. Please have your labs drawn in our clinic today -checking for allergies due to wheezing Your pulmonary function test will be scheduled closer to your next follow up visit. Please rinse your mouth after inhaler use.

## 2024-04-28 ENCOUNTER — Other Ambulatory Visit: Payer: Self-pay

## 2024-04-28 DIAGNOSIS — Z122 Encounter for screening for malignant neoplasm of respiratory organs: Secondary | ICD-10-CM

## 2024-04-28 DIAGNOSIS — Z87891 Personal history of nicotine dependence: Secondary | ICD-10-CM

## 2024-04-28 DIAGNOSIS — F17210 Nicotine dependence, cigarettes, uncomplicated: Secondary | ICD-10-CM

## 2024-04-28 LAB — IGE: IgE (Immunoglobulin E), Serum: 126 kU/L — ABNORMAL HIGH (ref <=114)

## 2024-05-25 ENCOUNTER — Ambulatory Visit (INDEPENDENT_AMBULATORY_CARE_PROVIDER_SITE_OTHER): Admitting: Nurse Practitioner

## 2024-05-25 VITALS — BP 130/74 | HR 77 | Ht 75.0 in | Wt 231.0 lb

## 2024-05-25 DIAGNOSIS — Z7189 Other specified counseling: Secondary | ICD-10-CM

## 2024-05-25 DIAGNOSIS — I10 Essential (primary) hypertension: Secondary | ICD-10-CM | POA: Diagnosis not present

## 2024-05-25 DIAGNOSIS — Z720 Tobacco use: Secondary | ICD-10-CM | POA: Diagnosis not present

## 2024-05-25 DIAGNOSIS — I2583 Coronary atherosclerosis due to lipid rich plaque: Secondary | ICD-10-CM

## 2024-05-25 DIAGNOSIS — E785 Hyperlipidemia, unspecified: Secondary | ICD-10-CM

## 2024-05-25 DIAGNOSIS — I2510 Atherosclerotic heart disease of native coronary artery without angina pectoris: Secondary | ICD-10-CM

## 2024-05-25 DIAGNOSIS — I7121 Aneurysm of the ascending aorta, without rupture: Secondary | ICD-10-CM

## 2024-05-25 MED ORDER — METOPROLOL TARTRATE 100 MG PO TABS: 100.0000 mg | ORAL_TABLET | Freq: Once | ORAL | 0 refills | Status: AC

## 2024-05-25 NOTE — Progress Notes (Signed)
 Cardiology Office Note:  .   Date:  05/25/2024 ID:  William White, DOB Jan 16, 1971, MRN [id number] PCP: Regino Slater, MD [facility] Providers Cardiologist:  None   Patient Profile: .      PMH Aortic atherosclerosis Coronary artery disease CT Calcium score 03/27/24 CAC Score 356 (96th percentile) LM 0, LAD 123, LCx 2, RCA 231 Exercise tolerance test 02/2016 Hypertensive response to exercise No ischemia Tobacco abuse NAFLD Hyperlipidemia Emphysema    Lipid panel 11/2023 total cholesterol 151, LDL 76, HDL 49    History of Present Illness: .   Discussed the use of AI scribe software for clinical note transcription with the patient, who gave verbal consent to proceed.  History of Present Illness William White is a very pleasant 53 year old male who presents for evaluation of coronary artery calcification. CT scan showed calcium score of 356 (96th percentile) with calcification in LAD, RCA, and small amount in LCx.  He has a strong family history of cardiovascular disease, including a brother with myocardial infarction at age 69. He is a current smoker with emphysema followed by pulmonology and uses an inhaler twice daily with improved shortness of breath. He quit smoking in 2011 but has since resumed. Had a stress test in 2017 for symptoms thought to be cardiac, and he currently has no chest pain or significant exertional shortness of breath. He started rosuvastatin in October 2025 after his annual physical. Prior labs in June 2025 showed low HDL and an LDL of 76. He is not fasting for a cholesterol panel today. He works a sedentary job but stays somewhat active at work and tries to limit red meat, with some intake of processed foods, fruits, vegetables, whole grains, and lean proteins.  His father died at a young age without known coronary disease, but brother had MI at age 40.   Family history: His family history is not on file.  Brother had MI age 75  ASCVD Risk  Score: ASCVD (Atherosclerotic Cardiovascular Disease) Risk Algorithm including Known ASCVD from AHA/ACC from [email or web address] on 05/25/2024 ** All calculations should be rechecked by clinician prior to use **  RESULT SUMMARY: 3.6 % Risk of cardiovascular event (coronary or stroke death or non-fatal MI or stroke) in next 10 years.  No statin recommended because 10-year risk <5%; always encourage healthy cardiovascular lifestyle choices. Some patients with other high risk features may still be appropriate for treatment.   INPUTS: History of ASCVD --> 0 = No LDL Cholesterol >=190mg /dL (5.07 mmol/L) --> 0 = No Age --> 53 years Diabetes --> 0 = No Sex --> 1 = Male Total Cholesterol --> 151 mg/dL HDL Cholesterol --> 49 mg/dL Systolic Blood Pressure --> 130 mm Hg Treatment for Hypertension --> 1 = Yes Smoker --> 0 = No Race --> 1 = White  Diet: Generally healthy Occasional processed food  Activity: Moves throughout the day but no regular exercise  No results found for: LIPOA    ROS: See HPI       Studies Reviewed: SABRA   EKG Interpretation Date/Time:  Thursday May 25 2024 10:29:48 EST Ventricular Rate:  86 PR Interval:  148 QRS Duration:  84 QT Interval:  364 QTC Calculation: 435 R Axis:   59  Text Interpretation: Normal sinus rhythm Normal ECG When compared with ECG of 05-Dec-2015 12:45, T wave inversion no longer evident in Inferior leads Nonspecific T wave abnormality no longer evident in Lateral leads Confirmed by Percy Browning [phone]) on 05/25/2024 10:39:53  AM      Risk Assessment/Calculations:             Physical Exam:   VS: BP 130/74   Pulse 77   Ht 6' 3 (1.905 m)   Wt 231 lb (104.8 kg)   SpO2 97%   BMI 28.87 kg/m   Wt Readings from Last 3 Encounters:  05/25/24 231 lb (104.8 kg)  04/27/24 228 lb (103.4 kg)  02/17/16 274 lb (124.3 kg)     GEN: Well nourished, well developed in no acute distress NECK: No JVD; No carotid bruits CARDIAC: RRR, no  murmurs, rubs, gallops RESPIRATORY:  Clear to auscultation without rales, wheezing or rhonchi  ABDOMEN: Soft, non-tender, non-distended EXTREMITIES:  No edema; No deformity     ASSESSMENT AND PLAN: .    Assessment & Plan Coronary artery calcification Cardiac risk Significantly elevated coronary calcium score of 356 (96 percentile) with calcification primarily in LAD and RCA, small amount in LCx.  Family history is significant for early CAD in his brother who had MI at age 50.  His father died at a young age not due to coronary disease.  He underwent exercise tolerance test in 2017 which did not reveal any concern for ischemia.  He has additional risk factors of hyperlipidemia and hypertension.  He is active but no formal exercise.  EKG reveals NSR with nonspecific ST abnormality.  He denies chest pain, dyspnea, or other symptoms concerning for angina, however based on age advanced CAD, family history, and other risk factors, we will proceed with ischemia evaluation.  He started rosuvastatin in October 2025 following elevated coronary calcium score.  He reports he had lipid testing around that time but I do not have those results to review. - Continue aspirin 81 mg daily -Repeat lipid testing including LP(a) and APO B for further risk stratification -Recommend at least 150 minutes of moderate exercise weekly - Follow dietary modifications to emphasize whole foods and avoiding processed food, sugar, and saturated fat  Hyperlipidemia LDL goal < 70 Last lipid panel available for my review from 03/22/2023 with total cholesterol 154, HDL 55, LDL-C 85, and triglycerides 76.  He reports LDL has never been significantly elevated.  He started rosuvastatin approximately 2 months ago.   - Recheck lipid panel for surveillance since starting rosuvastatin  - Check LP(a) and APO B for further risk stratification  - Continue rosuvastatin  - Heart healthy diet avoiding saturated fat, processed foods, sugar, and  other simple carbohydrates -Regular exercise encouraged  Aortic dilatation Mild ascending aortic dilatation 42 mm on CT 03/27/2024.  We are getting coronary CT for evaluation of ischemia.  Will review for measurement of aortic dilatation. - Plan for annual imaging unless no indication of dilatation on contrasted CT  Essential hypertension   BP is well controlled. Advised BP goal of 120/80 mmHg.  We will recheck renal function today in the setting of upcoming CT. - Continue losartan-hydrochlorothiazide  Nicotine dependence (cigarettes)   He has resumed smoking since previously quitting in 2011. Has Chantix  Rx available but has not used it. Advised smoking cessation is crucial for cardiovascular health.  - Complete cessation advised         Dispo: 3 months with me  Signed, Rosaline Bane, NP-C

## 2024-05-25 NOTE — Patient Instructions (Signed)
 Medication Instructions:   Your physician recommends that you continue on your current medications as directed. Please refer to the Current Medication list given to you today.   *If you need a refill on your cardiac medications before your next appointment, please call your pharmacy*  Lab Work:  Your physician recommends that you return for a FASTING lipid profile/bmet/alt/nmr/apolipo b/lpa   If you have labs (blood work) drawn today and your tests are completely normal, you will receive your results only by: MyChart Message (if you have MyChart) OR A paper copy in the mail If you have any lab test that is abnormal or we need to change your treatment, we will call you to review the results.  Testing/Procedures:    Your cardiac CT will be scheduled at one of the below locations:   [facility] [address]  [place], [place] [address]  If scheduled at the [facility] at [address], please enter the parking lot using the [address] entrance and use the FREE valet service at the patient drop-off area. Enter the building and check-in with registration on the main floor.  Please follow these instructions carefully (unless otherwise directed):  An IV will be required for this test and Nitroglycerin will be given.  Hold all erectile dysfunction medications at least 3 days (72 hrs) prior to test. (Ie viagra, cialis, sildenafil, tadalafil, etc)   On the Night Before the Test: Be sure to Drink plenty of water. Do not consume any caffeinated/decaffeinated beverages or chocolate 12 hours prior to your test. Do not take any antihistamines 12 hours prior to your test.  On the Day of the Test: Drink plenty of water until 1 hour prior to the test. Do not eat any food 1 hour prior to test. You may take your regular medications prior to the test.  Take metoprolol  (Lopressor ) one (1) tablet by mouth ( 100 mg) two hours prior to test. If you  take Hydrochlorothiazide  please HOLD on the morning of the test.     After the Test: Drink plenty of water. After receiving IV contrast, you may experience a mild flushed feeling. This is normal. On occasion, you may experience a mild rash up to 24 hours after the test. This is not dangerous. If this occurs, you can take Benadryl 25 mg, Zyrtec, Claritin, or Allegra and increase your fluid intake. (Patients taking Tikosyn should avoid Benadryl, and may take Zyrtec, Claritin, or Allegra) If you experience trouble breathing, this can be serious. If it is severe call 911 IMMEDIATELY. If it is mild, please call our office.  We will call to schedule your test 2-4 weeks out understanding that some insurance companies will need an authorization prior to the service being performed.   For more information and frequently asked questions, please visit our website : [email or web address]  For non-scheduling related questions, please contact the cardiac imaging nurse navigator should you have any questions/concerns: Cardiac Imaging Nurse Navigators Direct Office Dial: [phone]   For scheduling needs, including cancellations and rescheduling, please call Brittany, [phone].   Follow-Up: At [facility], you and your health needs are our priority.  As part of our continuing mission to provide you with exceptional heart care, our providers are all part of one team.  This team includes your primary Cardiologist (physician) and Advanced Practice Providers or APPs (Physician Assistants and Nurse Practitioners) who all work together to provide you with the care you need, when you  need it.  Your next appointment:   3 month(s)  Provider:   Rosaline Bane, NP    We recommend signing up for the patient portal called MyChart.  Sign up information is provided on this After Visit Summary.  MyChart is used to connect with patients for Virtual Visits (Telemedicine).  Patients are  able to view lab/test results, encounter notes, upcoming appointments, etc.  Non-urgent messages can be sent to your provider as well.   To learn more about what you can do with MyChart, go to [email or web address].   Other Instructions  Adopting a Healthy Lifestyle.   Weight: Know what a healthy weight is for you (roughly BMI <25) and aim to maintain this. You can calculate your body mass index on your smart phone. Unfortunately, this is not the most accurate measure of healthy weight, but it is the simplest measurement to use. A more accurate measurement involves body scanning which measures lean muscle, fat tissue and bony density. We do not have this equipment at [facility].    Diet: Aim for 7+ servings of fruits and vegetables daily Limit animal fats in diet for cholesterol and heart health - choose grass fed whenever available Avoid highly processed foods (fast food burgers, tacos, fried chicken, pizza, hot dogs, french fries)  Saturated fat comes in the form of butter, lard, coconut oil, margarine, partially hydrogenated oils, dairy products, and fat in meat. These increase your risk of cardiovascular disease.  Use healthy plant oils, such as olive, canola, soy, corn, sunflower and peanut.  Whole foods such as fruits, vegetables and whole grains have fiber  Men need > 38 grams of fiber per day Women need > 25 grams of fiber per day  Load up on vegetables and fruits - one-half of your plate: Aim for color and variety, and remember that potatoes dont count. Go for whole grains - one-quarter of your plate: Whole wheat, barley, wheat berries, quinoa, oats, brown rice, and foods made with them. If you want pasta, go with whole wheat pasta. Protein power - one-quarter of your plate: Fish, chicken, beans, and nuts are all healthy, versatile protein sources. Limit red meat. You need carbohydrates for energy! The type of carbohydrate is more important than the amount. Choose carbohydrates such as  vegetables, fruits, whole grains, beans, and nuts in the place of white rice, white pasta, potatoes (baked or fried), macaroni and cheese, cakes, cookies, and donuts.  If youre thirsty, drink water. Coffee and tea are good in moderation, but skip sugary drinks and limit milk and dairy products to one or two daily servings. Keep sugar intake at 6 teaspoons or 24 grams or LESS       Exercise: Aim for 150 min of moderate intensity exercise weekly for heart health, and weights twice weekly for bone health Stay active - any steps are better than no steps! Aim for 7-9 hours of sleep daily   Sleep: This provides your body with the reset and relaxation that it needs!  Aim to get 7-8 hours of sleep each night. Limit caffeine, screen time, and other distractions prior to bedtime.  Keep your bedroom cool and dark and do not wear heavy clothing to bed or use heavy bed covers - layer if needed.

## 2024-06-01 LAB — NMR, LIPOPROFILE
Cholesterol, Total: 173 mg/dL (ref 100–199)
HDL Particle Number: 40.3 umol/L
HDL-C: 61 mg/dL
LDL Particle Number: 897 nmol/L
LDL Size: 21.6 nm
LDL-C (NIH Calc): 84 mg/dL (ref 0–99)
LP-IR Score: 63 — ABNORMAL HIGH
Small LDL Particle Number: 178 nmol/L
Triglycerides: 165 mg/dL — ABNORMAL HIGH (ref 0–149)

## 2024-06-01 LAB — BASIC METABOLIC PANEL WITH GFR
BUN/Creatinine Ratio: 8 — ABNORMAL LOW (ref 9–20)
BUN: 6 mg/dL (ref 6–24)
CO2: 25 mmol/L (ref 20–29)
Calcium: 9.5 mg/dL (ref 8.7–10.2)
Chloride: 102 mmol/L (ref 96–106)
Creatinine, Ser: 0.71 mg/dL — ABNORMAL LOW (ref 0.76–1.27)
Glucose: 72 mg/dL (ref 70–99)
Potassium: 4 mmol/L (ref 3.5–5.2)
Sodium: 134 mmol/L (ref 134–144)
eGFR: 110 mL/min/1.73

## 2024-06-01 LAB — APOLIPOPROTEIN B: Apolipoprotein B: 75 mg/dL

## 2024-06-01 LAB — ALT: ALT: 24 IU/L (ref 0–44)

## 2024-06-01 LAB — LIPOPROTEIN A (LPA): Lipoprotein (a): 183.8 nmol/L — ABNORMAL HIGH

## 2024-06-04 ENCOUNTER — Ambulatory Visit (HOSPITAL_BASED_OUTPATIENT_CLINIC_OR_DEPARTMENT_OTHER): Payer: Self-pay | Admitting: Nurse Practitioner

## 2024-06-04 DIAGNOSIS — E785 Hyperlipidemia, unspecified: Secondary | ICD-10-CM

## 2024-06-04 DIAGNOSIS — I77810 Thoracic aortic ectasia: Secondary | ICD-10-CM

## 2024-06-04 DIAGNOSIS — I2510 Atherosclerotic heart disease of native coronary artery without angina pectoris: Secondary | ICD-10-CM

## 2024-06-05 ENCOUNTER — Encounter (HOSPITAL_COMMUNITY): Payer: Self-pay

## 2024-06-06 ENCOUNTER — Other Ambulatory Visit (HOSPITAL_COMMUNITY): Payer: Self-pay

## 2024-06-06 ENCOUNTER — Telehealth: Payer: Self-pay | Admitting: Pharmacy Technician

## 2024-06-06 NOTE — Telephone Encounter (Signed)
 Pharmacy Patient Advocate Encounter   Received notification from Physician's Office that prior authorization for Repatha is required/requested.   Insurance verification completed.   The patient is insured through [employer].   Per test claim: PA required; PA submitted to above mentioned insurance via Latent Key/confirmation #/[employer]OC [employer] Status is pending

## 2024-06-07 ENCOUNTER — Ambulatory Visit (HOSPITAL_COMMUNITY)
Admission: RE | Admit: 2024-06-07 | Discharge: 2024-06-07 | Disposition: A | Discharge status: Home / Self Care | Source: Ambulatory Visit | Attending: Student in an Organized Health Care Education/Training Program | Admitting: Student in an Organized Health Care Education/Training Program

## 2024-06-07 DIAGNOSIS — I2510 Atherosclerotic heart disease of native coronary artery without angina pectoris: Secondary | ICD-10-CM | POA: Insufficient documentation

## 2024-06-07 DIAGNOSIS — Z7189 Other specified counseling: Secondary | ICD-10-CM | POA: Diagnosis present

## 2024-06-07 DIAGNOSIS — I2583 Coronary atherosclerosis due to lipid rich plaque: Secondary | ICD-10-CM | POA: Diagnosis present

## 2024-06-07 MED ORDER — DILTIAZEM HCL 25 MG/5ML IV SOLN: 10.0000 mg | INTRAVENOUS | Status: DC | PRN

## 2024-06-07 MED ORDER — IOHEXOL 350 MG/ML SOLN
100.0000 mL | Freq: Once | INTRAVENOUS | Status: AC | PRN
  Administered 2024-06-07: 100 mL via INTRAVENOUS

## 2024-06-07 MED ORDER — NITROGLYCERIN 0.4 MG SL SUBL
0.8000 mg | SUBLINGUAL_TABLET | Freq: Once | SUBLINGUAL | Status: AC
  Administered 2024-06-07: 0.8 mg via SUBLINGUAL

## 2024-06-07 MED ORDER — METOPROLOL TARTRATE 5 MG/5ML IV SOLN: 10.0000 mg | Freq: Once | INTRAVENOUS | Status: DC | PRN

## 2024-06-09 MED ORDER — EZETIMIBE 10 MG PO TABS: 10.0000 mg | ORAL_TABLET | Freq: Every day | ORAL | 3 refills | Status: AC

## 2024-06-09 NOTE — Telephone Encounter (Signed)
 Looks like they wont cover without him trying zetia first

## 2024-06-09 NOTE — Telephone Encounter (Signed)
 Pharmacy Patient Advocate Encounter  Received notification from [employer] that Prior Authorization for repatha has been DENIED.  Full denial letter will be uploaded to the media tab. See denial reason below.   PA #/Case ID/Reference #: in media

## 2024-06-22 ENCOUNTER — Encounter (HOSPITAL_BASED_OUTPATIENT_CLINIC_OR_DEPARTMENT_OTHER)

## 2024-06-22 ENCOUNTER — Ambulatory Visit

## 2024-08-11 ENCOUNTER — Encounter

## 2024-08-11 ENCOUNTER — Ambulatory Visit

## 2024-08-28 ENCOUNTER — Ambulatory Visit (HOSPITAL_BASED_OUTPATIENT_CLINIC_OR_DEPARTMENT_OTHER): Admitting: Nurse Practitioner
